# Patient Record
Sex: Male | Born: 1935 | Race: White | Hispanic: No | Smoking: Former smoker
Health system: Southern US, Community
[De-identification: ages and names within clinical notes are randomized; demographics above are authoritative.]

## PROBLEM LIST (undated history)

## (undated) DIAGNOSIS — I2510 Atherosclerotic heart disease of native coronary artery without angina pectoris: Secondary | ICD-10-CM

## (undated) DIAGNOSIS — I10 Essential (primary) hypertension: Secondary | ICD-10-CM

## (undated) DIAGNOSIS — F0390 Unspecified dementia without behavioral disturbance: Secondary | ICD-10-CM

## (undated) DIAGNOSIS — I429 Cardiomyopathy, unspecified: Secondary | ICD-10-CM

## (undated) DIAGNOSIS — E7800 Pure hypercholesterolemia, unspecified: Secondary | ICD-10-CM

## (undated) DIAGNOSIS — I5022 Chronic systolic (congestive) heart failure: Secondary | ICD-10-CM

## (undated) DIAGNOSIS — E119 Type 2 diabetes mellitus without complications: Secondary | ICD-10-CM

## (undated) HISTORY — DX: Cardiomyopathy, unspecified: I42.9

## (undated) HISTORY — DX: Chronic systolic (congestive) heart failure: I50.22

## (undated) HISTORY — PX: CATARACT EXTRACTION: SUR2

## (undated) HISTORY — PX: CARDIAC SURGERY: SHX584

---

## 2013-07-11 ENCOUNTER — Other Ambulatory Visit: Payer: Self-pay

## 2013-07-11 ENCOUNTER — Emergency Department (HOSPITAL_COMMUNITY): Payer: Medicare Other

## 2013-07-11 ENCOUNTER — Inpatient Hospital Stay (HOSPITAL_COMMUNITY)
Admission: EM | Admit: 2013-07-11 | Discharge: 2013-07-12 | DRG: 311 | Disposition: A | Discharge status: Home / Self Care | Payer: Medicare Other | Attending: Internal Medicine | Admitting: Internal Medicine

## 2013-07-11 ENCOUNTER — Encounter (HOSPITAL_COMMUNITY): Payer: Self-pay | Admitting: Emergency Medicine

## 2013-07-11 DIAGNOSIS — I5022 Chronic systolic (congestive) heart failure: Secondary | ICD-10-CM | POA: Diagnosis present

## 2013-07-11 DIAGNOSIS — I472 Ventricular tachycardia, unspecified: Secondary | ICD-10-CM | POA: Diagnosis present

## 2013-07-11 DIAGNOSIS — Z79899 Other long term (current) drug therapy: Secondary | ICD-10-CM

## 2013-07-11 DIAGNOSIS — I509 Heart failure, unspecified: Secondary | ICD-10-CM | POA: Diagnosis present

## 2013-07-11 DIAGNOSIS — I4729 Other ventricular tachycardia: Secondary | ICD-10-CM | POA: Diagnosis present

## 2013-07-11 DIAGNOSIS — I739 Peripheral vascular disease, unspecified: Secondary | ICD-10-CM

## 2013-07-11 DIAGNOSIS — H9190 Unspecified hearing loss, unspecified ear: Secondary | ICD-10-CM | POA: Diagnosis present

## 2013-07-11 DIAGNOSIS — E119 Type 2 diabetes mellitus without complications: Secondary | ICD-10-CM

## 2013-07-11 DIAGNOSIS — F0390 Unspecified dementia without behavioral disturbance: Secondary | ICD-10-CM | POA: Diagnosis present

## 2013-07-11 DIAGNOSIS — E785 Hyperlipidemia, unspecified: Secondary | ICD-10-CM | POA: Diagnosis present

## 2013-07-11 DIAGNOSIS — IMO0002 Reserved for concepts with insufficient information to code with codable children: Secondary | ICD-10-CM

## 2013-07-11 DIAGNOSIS — I209 Angina pectoris, unspecified: Principal | ICD-10-CM | POA: Diagnosis present

## 2013-07-11 DIAGNOSIS — R079 Chest pain, unspecified: Secondary | ICD-10-CM

## 2013-07-11 DIAGNOSIS — I447 Left bundle-branch block, unspecified: Secondary | ICD-10-CM | POA: Diagnosis present

## 2013-07-11 DIAGNOSIS — Z9861 Coronary angioplasty status: Secondary | ICD-10-CM

## 2013-07-11 DIAGNOSIS — IMO0001 Reserved for inherently not codable concepts without codable children: Secondary | ICD-10-CM | POA: Diagnosis present

## 2013-07-11 DIAGNOSIS — I4891 Unspecified atrial fibrillation: Secondary | ICD-10-CM

## 2013-07-11 DIAGNOSIS — I498 Other specified cardiac arrhythmias: Secondary | ICD-10-CM

## 2013-07-11 DIAGNOSIS — E876 Hypokalemia: Secondary | ICD-10-CM

## 2013-07-11 DIAGNOSIS — M069 Rheumatoid arthritis, unspecified: Secondary | ICD-10-CM | POA: Diagnosis present

## 2013-07-11 DIAGNOSIS — I2510 Atherosclerotic heart disease of native coronary artery without angina pectoris: Secondary | ICD-10-CM

## 2013-07-11 DIAGNOSIS — Z87891 Personal history of nicotine dependence: Secondary | ICD-10-CM

## 2013-07-11 DIAGNOSIS — I2589 Other forms of chronic ischemic heart disease: Secondary | ICD-10-CM | POA: Diagnosis present

## 2013-07-11 DIAGNOSIS — Z7902 Long term (current) use of antithrombotics/antiplatelets: Secondary | ICD-10-CM

## 2013-07-11 DIAGNOSIS — E1165 Type 2 diabetes mellitus with hyperglycemia: Secondary | ICD-10-CM

## 2013-07-11 DIAGNOSIS — I10 Essential (primary) hypertension: Secondary | ICD-10-CM | POA: Diagnosis present

## 2013-07-11 HISTORY — DX: Atherosclerotic heart disease of native coronary artery without angina pectoris: I25.10

## 2013-07-11 HISTORY — DX: Type 2 diabetes mellitus without complications: E11.9

## 2013-07-11 HISTORY — DX: Unspecified dementia, unspecified severity, without behavioral disturbance, psychotic disturbance, mood disturbance, and anxiety: F03.90

## 2013-07-11 HISTORY — DX: Essential (primary) hypertension: I10

## 2013-07-11 HISTORY — DX: Pure hypercholesterolemia, unspecified: E78.00

## 2013-07-11 LAB — TROPONIN I: Troponin I: <0.3 ng/mL (ref <0.30)

## 2013-07-11 LAB — CBC WITH DIFFERENTIAL/PLATELET
Basophils Absolute: 0 10*3/uL (ref 0.0–0.1)
Basophils Relative: 0 % (ref 0–1)
Eosinophils Relative: 4 % (ref 0–5)
Lymphocytes Relative: 14 % (ref 12–46)
Lymphs Abs: 1.2 10*3/uL (ref 0.7–4.0)
MCV: 85.7 fL (ref 78.0–100.0)
Neutrophils Relative %: 70 % (ref 43–77)
Platelets: 192 10*3/uL (ref 150–400)
RBC: 4.47 MIL/uL (ref 4.22–5.81)
RDW: 12.9 % (ref 11.5–15.5)
WBC: 8.9 10*3/uL (ref 4.0–10.5)

## 2013-07-11 LAB — COMPREHENSIVE METABOLIC PANEL
ALT: 13 U/L (ref 0–53)
AST: 16 U/L (ref 0–37)
Alkaline Phosphatase: 99 U/L (ref 39–117)
CO2: 28 mEq/L (ref 19–32)
Calcium: 8.9 mg/dL (ref 8.4–10.5)
Creatinine, Ser: 0.95 mg/dL (ref 0.50–1.35)
GFR calc non Af Amer: 79 mL/min — ABNORMAL LOW (ref >90)
Potassium: 3.4 mEq/L — ABNORMAL LOW (ref 3.5–5.1)
Sodium: 138 mEq/L (ref 135–145)
Total Protein: 6.5 g/dL (ref 6.0–8.3)

## 2013-07-11 LAB — GLUCOSE, CAPILLARY
Glucose-Capillary: 190 mg/dL — ABNORMAL HIGH (ref 70–99)
Glucose-Capillary: 184 mg/dL — ABNORMAL HIGH (ref 70–99)
Glucose-Capillary: 158 mg/dL — ABNORMAL HIGH (ref 70–99)

## 2013-07-11 LAB — PROTIME-INR
INR: 1.12 (ref 0.00–1.49)
Prothrombin Time: 14.2 seconds (ref 11.6–15.2)

## 2013-07-11 LAB — PRO B NATRIURETIC PEPTIDE: Pro B Natriuretic peptide (BNP): 3306 pg/mL — ABNORMAL HIGH (ref 0–450)

## 2013-07-11 MED ORDER — ATORVASTATIN CALCIUM 40 MG PO TABS
40.0000 mg | ORAL_TABLET | Freq: Every day | ORAL | Status: DC
  Administered 2013-07-12: 40 mg via ORAL
  Filled 2013-07-11: qty 1

## 2013-07-11 MED ORDER — FUROSEMIDE 40 MG PO TABS
40.0000 mg | ORAL_TABLET | Freq: Two times a day (BID) | ORAL | Status: DC
  Administered 2013-07-12 (×2): 40 mg via ORAL
  Filled 2013-07-11 (×4): qty 1

## 2013-07-11 MED ORDER — TRAZODONE HCL 50 MG PO TABS
50.0000 mg | ORAL_TABLET | Freq: Every day | ORAL | Status: DC
  Filled 2013-07-11 (×2): qty 1

## 2013-07-11 MED ORDER — ENOXAPARIN SODIUM 40 MG/0.4ML ~~LOC~~ SOLN
40.0000 mg | SUBCUTANEOUS | Status: DC
  Administered 2013-07-11 – 2013-07-12 (×2): 40 mg via SUBCUTANEOUS
  Filled 2013-07-11 (×2): qty 0.4

## 2013-07-11 MED ORDER — NITROGLYCERIN 2 % TD OINT
0.5000 [in_us] | TOPICAL_OINTMENT | TRANSDERMAL | Status: AC
  Administered 2013-07-11: 0.5 [in_us] via TOPICAL
  Filled 2013-07-11: qty 1

## 2013-07-11 MED ORDER — TRAMADOL HCL 50 MG PO TABS
50.0000 mg | ORAL_TABLET | Freq: Four times a day (QID) | ORAL | Status: DC | PRN
  Administered 2013-07-11 – 2013-07-12 (×2): 50 mg via ORAL
  Filled 2013-07-11 (×2): qty 1

## 2013-07-11 MED ORDER — POTASSIUM CHLORIDE CRYS ER 20 MEQ PO TBCR
20.0000 meq | EXTENDED_RELEASE_TABLET | Freq: Once | ORAL | Status: AC
  Administered 2013-07-11: 20 meq via ORAL
  Filled 2013-07-11: qty 1

## 2013-07-11 MED ORDER — PREDNISONE 5 MG PO TABS
5.0000 mg | ORAL_TABLET | Freq: Every day | ORAL | Status: DC
  Administered 2013-07-12: 07:00:00 5 mg via ORAL
  Filled 2013-07-11 (×2): qty 1

## 2013-07-11 MED ORDER — CLOPIDOGREL BISULFATE 75 MG PO TABS
75.0000 mg | ORAL_TABLET | Freq: Every day | ORAL | Status: DC
  Administered 2013-07-12: 11:00:00 75 mg via ORAL
  Filled 2013-07-11 (×2): qty 1

## 2013-07-11 MED ORDER — SODIUM CHLORIDE 0.9 % IJ SOLN
3.0000 mL | Freq: Two times a day (BID) | INTRAMUSCULAR | Status: DC
  Administered 2013-07-11 – 2013-07-12 (×2): 3 mL via INTRAVENOUS

## 2013-07-11 MED ORDER — ASPIRIN 81 MG PO CHEW
81.0000 mg | CHEWABLE_TABLET | Freq: Every day | ORAL | Status: DC
  Administered 2013-07-11 – 2013-07-12 (×2): 81 mg via ORAL
  Filled 2013-07-11 (×2): qty 1

## 2013-07-11 MED ORDER — METOPROLOL TARTRATE 12.5 MG HALF TABLET
12.5000 mg | ORAL_TABLET | Freq: Two times a day (BID) | ORAL | Status: DC
  Administered 2013-07-11 (×2): 12.5 mg via ORAL
  Filled 2013-07-11 (×4): qty 1

## 2013-07-11 NOTE — H&P (Signed)
Date: 07/11/2013               Patient Name:  Oscar Calderon MRN: [id number]  DOB: Apr 09, 1935 Age / Sex: 78 y.o., male   PCP: No Pcp Per Patient         Medical Service: Internal Medicine Teaching Service         Attending Physician: Dr. Farley Ly, MD    First Contact: Dr. Boykin Peek, MD Pager: [phone]  Second Contact: Dr. Dow Adolph, MD Pager: [phone]       After Hours (After 5p/  First Contact Pager: [phone]  weekends / holidays): Second Contact Pager: [phone]   Chief Complaint: Chest Pain  History of Present Illness: Pt is a 78 yo male from [place], [place] with a PMH significant for HTN, DM2, dyslipidemia, RA, CAD s/p stents, Atrial Fibrillation.  Patient c/o chest pain. Onset was last evening at 1:00am while he was lying in the bed.  He took 2 NTG tabs with no relief.  He states the pain is sharp and located at his left shoulder and radiates across his chest to his right upper abdomen.  He says the CP was continuous and then began to radiate into his left neck.  He states it "felt like gas."  He denies any N/V, cough, diaphoresis, dizziness, or presyncope.  He states he has SOB but this is at his baseline. He is without orthopnea or PND.  He also states the CP feels like his normal angina but is slightly worse.  He states that NTG usually makes his CP go away.  Currently he denies any CP after receiving NTG paste in the ED.  He denies any GERD symptoms.  He does report having moved a queen mattress and box springs on Friday.  He denies current tobacco use, but used to smoke for 35 years ago.  He reports using CPAP at night.  He denies any other recreational drug use.   Pt has bilateral hearing aids and lives alone and has a neighbor who frequently checks on him.  He apparently was out of his medication for a time.   According to ED notes, he received 3 NTG tablets and ASA by EMS and his CP decreased to 3/10. His EKG in route was suspicious for a STEMI which was called by  EMS. Cardiology was present upon arrival and activated code STEMI.  Pt appears to have a LBBB but we have no previous EKG for comparison. There were no concerning features of the LBBB.  He was afebrile and VS were wnl except for mild bradycardia (46-79).  He received NTG paste and called IMTS to admit.  Additionally, he has a h/o atrial fibrillation but refuses coumadin.   Meds:  Current Facility-Administered Medications  Medication Dose Route Frequency Provider Last Rate Last Dose  . aspirin chewable tablet 81 mg  81 mg Oral Daily Jodelle Gross, NP   81 mg at 07/11/13 1347  . [START ON 07/12/2013] clopidogrel (PLAVIX) tablet 75 mg  75 mg Oral Q breakfast Jodelle Gross, NP      . furosemide (LASIX) tablet 40 mg  40 mg Oral BID Jodelle Gross, NP      . metoprolol tartrate (LOPRESSOR) tablet 12.5 mg  12.5 mg Oral BID Jodelle Gross, NP   12.5 mg at 07/11/13 1348    Prescriptions prior to admission  Medication Sig Dispense Refill  . atorvastatin (LIPITOR) 40 MG tablet Take 40 mg by mouth daily.      Marland Kitchen  clopidogrel (PLAVIX) 75 MG tablet Take 75 mg by mouth daily with breakfast.      . furosemide (LASIX) 40 MG tablet Take 40 mg by mouth 2 (two) times daily.      . hydrocortisone cream 1 % Apply 1 application topically as needed for itching.      Marland Kitchen MENTHOL-METHYL SALICYLATE EX Apply 1 application topically 3 (three) times daily as needed (muscle pain).      . metoprolol succinate (TOPROL-XL) 25 MG 24 hr tablet Take 25 mg by mouth daily.      . predniSONE (DELTASONE) 5 MG tablet Take 5 mg by mouth daily with breakfast.      . traMADol (ULTRAM) 50 MG tablet Take 50 mg by mouth every 6 (six) hours as needed for moderate pain.      . traZODone (DESYREL) 50 MG tablet Take 50 mg by mouth at bedtime.        Allergies:  Allergies as of 07/11/2013 - Review Complete 07/11/2013  Allergen Reaction Noted  . Codeine  07/11/2013  . Other  07/11/2013   Past Medical History  Diagnosis  Date  . Hypertension   . Diabetes   . Hypercholesterolemia   . Dementia   . CAD (coronary artery disease)     Stent in 2013 at [facility], with 3 previous stents to unknown arteries at unknown date.   Past Surgical History  Procedure Laterality Date  . Cardiac surgery    . Cataract extraction     No family history on file. History   Social History  . Marital Status: Widowed    Spouse Name: N/A    Number of Children: N/A  . Years of Education: N/A   Occupational History  . Not on file.   Social History Main Topics  . Smoking status: Former Games developer  . Smokeless tobacco: Not on file  . Alcohol Use: No  . Drug Use: No  . Sexual Activity: Not on file   Other Topics Concern  . Not on file   Social History Narrative  . No narrative on file    Review of Systems: Pertinent items are noted in HPI.  Physical Exam: Physical Exam  Vitals reviewed. Constitutional: He is oriented to person, place, and time and well-developed, well-nourished, and in no distress. No distress.  HENT:  Head: Normocephalic.  Right Ear: No decreased hearing is noted.  Left Ear: No decreased hearing is noted.  Mouth/Throat: Oropharynx is clear and moist.  b/l hearing aids    Eyes: Conjunctivae and EOM are normal. Pupils are equal, round, and reactive to light.  Neck: Neck supple. No JVD present.  Cardiovascular: Normal rate and intact distal pulses.  An irregularly irregular rhythm present. Exam reveals no gallop and no friction rub.   No murmur heard. Pulmonary/Chest: Effort normal. No accessory muscle usage. Not tachypneic. No respiratory distress. He has decreased breath sounds. He has no wheezes. He has no rales. He exhibits no tenderness.  Abdominal: Soft. Bowel sounds are normal. He exhibits no distension. There is no tenderness.  Neurological: He is alert and oriented to person, place, and time.  Skin: Skin is warm and dry. He is not diaphoretic.    Lab results:   Basic Metabolic  Panel:  Recent Labs  07/11/13 0945  NA 138  K 3.4*  CL 100  CO2 28  GLUCOSE 188*  BUN 17  CREATININE 0.95  CALCIUM 8.9   Liver Function Tests:  Recent Labs  07/11/13 0945  AST 16  ALT 13  ALKPHOS 99  BILITOT 0.9  PROT 6.5  ALBUMIN 3.7   CBC:  Recent Labs  07/11/13 0945  WBC 8.9  NEUTROABS 6.2  HGB 13.5  HCT 38.3*  MCV 85.7  PLT 192   Cardiac Enzymes:  Recent Labs  07/11/13 0945 07/11/13 1420  TROPONINI <0.30 <0.30   BNP:  Recent Labs  07/11/13 0945  PROBNP 3306.0*   CBG:  Recent Labs  07/11/13 0933  GLUCAP 190*   Coagulation:  Recent Labs  07/11/13 0945  LABPROT 14.2  INR 1.12    Imaging results:  Dg Chest Port 1 View  07/11/2013   CLINICAL DATA:  Chest pain  EXAM: PORTABLE CHEST - 1 VIEW  COMPARISON:  None.  FINDINGS: The cardiac shadow is mildly enlarged. Mild vascular congestion is noted. No focal infiltrate is seen. No acute bony abnormality is noted.  IMPRESSION: Mild congestive failure.   Electronically Signed   By: Alcide Clever M.D.   On: 07/11/2013 10:10    Other results: Orders placed during the hospital encounter of 07/11/13  . EKG 12-LEAD  . EKG 12-LEAD     Assessment & Plan by Problem: Principal Problem:   Cardiac angina Active Problems:   Peripheral vascular disease   Diabetes   Coronary artery disease   Acute angina  #Atypical Chest Pain- Pt has risk factors for ACS including age, DM2, HTN, dyslipidemia, CAD s/p 4 stents.  TIMI score: 4, which is a 20% risk at 14 days of: all-cause mortality, new or recurrent MI, or severe recurrent ischemia requiring urgent revascularization.  ACS seems unlikely given CP is continuous, description of sharp quality, non-exertional, "feels like gas".  However, ACS must be r/o given risk factors.  Other possible etiologies include:  musculoskeletal (pt reports moving a mattress/box springs 2 days ago), pneumothorax (no CXR findings), aortic dissection (pain not described as  tearing/ripping, no widened mediastinum on CXR), PE (no pleuritic CP, no tachycardia, Wells Score: 0), GERD (denies symptoms), anxiety (pt does not appear anxious/no previous h/o), pneumonia (afebrile, not pleuritic CP, no leukocytosis).  Troponin X 3 negative.  EKG does show some T wave inversions in V5, V6-no previous EKG for comparison.  He did have a 2-3 s run of non-sustained V-tach.  Cardiology consulted and recommend obtaining records from [facility] and will get TTE tomorrow.  -appreciate cardiology recs -telemetry -TTE -ASA  -NTG  -continue home meds -O2 -lipid panel -HA1c -CMP  #Atrial Fibrillation-Pt has atrial fibrillation on EKG with slow ventricular response.  Pt has refused to take coumadin now and in the past.  CHADS2 Score: 3. High risk of thromboembolic event. 5.9% risk of event per year if no coumadin.  The adjusted stroke rate was the expected stroke rate per 100 person-years derived from the multivariable model assuming that aspirin was not taken.  INR is sub-therapeutic, however, pt is not on coumadin.   Lab Results  Component Value Date   INR 1.12 07/11/2013   -ASA 81mg  daily -Plavix daily -pt refuses coumadin   #DM2- Pt was on SSI at home; his PCP changed his dose 2 days ago.    -HA1c -CBG  -SSI?  #HTN- Stable.   BP Readings from Last 3 Encounters:  07/11/13 132/82   -continue home meds -TTE tomorrow  #Dyslipidemia-   -continue home meds -lipid panel   #h/o RA- Pt reports a h/o RA for which he takes prednisone 5mg  daily.  -continue home meds   Dispo: Disposition  is deferred at this time, awaiting improvement of current medical problems. Anticipated discharge in approximately 1-2 day(s).   The patient does have a current PCP and does need an [facility] hospital follow-up appointment after discharge.   Signed: Boykin Peek, MD 07/11/2013, 4:12 PM

## 2013-07-11 NOTE — ED Notes (Signed)
Internal medicine at bedside
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
## 2013-07-11 NOTE — ED Notes (Signed)
PT STATES DOES NOT READ/WRITE.

## 2013-07-11 NOTE — Significant Event (Signed)
Patient hr dropped to 86 Dr Anne Fu noticed up to see patient

## 2013-07-11 NOTE — ED Notes (Signed)
PT reports CP started at 0300 center chest radiating to bilateral jaw. Pt has a Hx of total 5 stents.EMS gave a total of 3 nitro SL.

## 2013-07-11 NOTE — Significant Event (Signed)
Dr Anne Fu called notified  that patient came up from ED with pacer pads on that were not reported by ED RN to 4E RN, and without MD order . Dr Anne Fu confirmed may D/C pacer pads.

## 2013-07-11 NOTE — ED Notes (Signed)
CBG 190

## 2013-07-11 NOTE — ED Notes (Signed)
Cardiology MD at bedside.

## 2013-07-11 NOTE — Consult Note (Signed)
Patient was examined and history, data, reviewed after being obtained by Joni Reining, NP. I agree with above. Atypical chest discomfort, mostly right-sided. Multiple social issues including mild dementia.  Prior to Admission medications   Medication  Sig  Start Date  End Date  Taking?  Authorizing Provider   atorvastatin (LIPITOR) 40 MG tablet  Take 40 mg by mouth daily.    Yes  Historical Provider, MD   clopidogrel (PLAVIX) 75 MG tablet  Take 75 mg by mouth daily with breakfast.    Yes  Historical Provider, MD   furosemide (LASIX) 40 MG tablet  Take 40 mg by mouth 2 (two) times daily.    Yes  Historical Provider, MD   hydrocortisone cream 1 %  Apply 1 application topically as needed for itching.    Yes  Historical Provider, MD   MENTHOL-METHYL SALICYLATE EX  Apply 1 application topically 3 (three) times daily as needed (muscle pain).    Yes  Historical Provider, MD   metoprolol succinate (TOPROL-XL) 25 MG 24 hr tablet  Take 25 mg by mouth daily.    Yes  Historical Provider, MD   predniSONE (DELTASONE) 5 MG tablet  Take 5 mg by mouth daily with breakfast.    Yes  Historical Provider, MD   traMADol (ULTRAM) 50 MG tablet  Take 50 mg by mouth every 6 (six) hours as needed for moderate pain.    Yes  Historical Provider, MD   traZODone (DESYREL) 50 MG tablet  Take 50 mg by mouth at bedtime.    Yes  Historical Provider, MD   1. Chest pain-he has been off of his medications he states for quite some time. First troponin is normal. EKG demonstrates intraventricular conduction delay, left bundle branch block. We will go ahead and obtain records from [facility] where most recent stent has been placed per patient. At this point, until we gather from the records, I would hold off on further cardiac testing to prevent reduplication.  2. Atrial fibrillation-noted on telemetry as well as EKGs in emergency department. He was not familiar with the term atrial fibrillation however when asked if he ever been on Coumadin  he clearly stated that he would never take that "rat poisoning ". I stated that we could discuss this further and he clearly told me that he would not take this medication. He understands increased risk for stroke. Currently on Plavix which will decrease her stroke risk slightly.  3. Peripheral vascular disease-prior iliac stents per patient. Good distal pulses. No claudication.  4. Nonsustained ventricular tachycardia-brief run of approximately 10 beats seen in emergency department. This wide-complex tachycardia could also be atrial fibrillation with aberrancy. Most likely VT. Asymptomatic. Try to use home dose beta blocker if able to from heart rate perspective.  5. Suspected cardiomyopathy-likely ischemic. Would correlate with NSVT. I suspect decreased EF as well. Obtain records. Continue home dose Lasix 40 twice a day.  His troponins remained normal and he remains comfortable, early discharge may be applicable in his recent cardiac workup at [facility].  Maryclare Labrador continue to follow. Appreciate internal medicine team.  Donato Schultz, MD  07/11/13, 1:32pm

## 2013-07-11 NOTE — ED Provider Notes (Signed)
CSN: [id number]     Arrival date & time 07/11/13  0915 History   First MD Initiated Contact with Patient 07/11/13 [id number]     Chief Complaint  Patient presents with  . Chest Pain   (Consider location/radiation/quality/duration/timing/severity/associated sxs/prior Treatment) Patient is a 78 y.o. male presenting with chest pain. The history is provided by the patient.  Chest Pain Pain location:  L chest Pain quality: sharp   Pain radiates to:  Neck and L shoulder Pain radiates to the back: no   Pain severity:  Moderate Onset quality:  Gradual Timing:  Intermittent Progression:  Worsening Chronicity:  Recurrent Context: at rest   Relieved by:  Nitroglycerin Worsened by:  Nothing tried Ineffective treatments:  None tried Associated symptoms: shortness of breath   Associated symptoms: no abdominal pain, no cough, no fever, no headache, no nausea, no numbness and not vomiting     Past Medical History  Diagnosis Date  . Heart attack     Hx of stents   Past Surgical History  Procedure Laterality Date  . Cardiac surgery     No family history on file. History  Substance Use Topics  . Smoking status: Not on file  . Smokeless tobacco: Not on file  . Alcohol Use: Not on file    Review of Systems  Constitutional: Negative for fever.  HENT: Negative for drooling and rhinorrhea.   Eyes: Negative for pain.  Respiratory: Positive for shortness of breath. Negative for cough.   Cardiovascular: Positive for chest pain. Negative for leg swelling.  Gastrointestinal: Negative for nausea, vomiting, abdominal pain and diarrhea.  Genitourinary: Negative for dysuria and hematuria.  Musculoskeletal: Negative for gait problem and neck pain.  Skin: Negative for color change.  Neurological: Negative for numbness and headaches.  Hematological: Negative for adenopathy.  Psychiatric/Behavioral: Negative for behavioral problems.  All other systems reviewed and are negative.    Allergies   Codeine and Other  Home Medications   Current Outpatient Rx  Name  Route  Sig  Dispense  Refill  . atorvastatin (LIPITOR) 40 MG tablet   Oral   Take 40 mg by mouth daily.         . clopidogrel (PLAVIX) 75 MG tablet   Oral   Take 75 mg by mouth daily with breakfast.         . furosemide (LASIX) 40 MG tablet   Oral   Take 40 mg by mouth 2 (two) times daily.         . hydrocortisone cream 1 %   Topical   Apply 1 application topically as needed for itching.         Marland Kitchen MENTHOL-METHYL SALICYLATE EX   Apply externally   Apply 1 application topically 3 (three) times daily as needed (muscle pain).         . metoprolol succinate (TOPROL-XL) 25 MG 24 hr tablet   Oral   Take 25 mg by mouth daily.         . predniSONE (DELTASONE) 5 MG tablet   Oral   Take 5 mg by mouth daily with breakfast.         . traMADol (ULTRAM) 50 MG tablet   Oral   Take 50 mg by mouth every 6 (six) hours as needed for moderate pain.         . traZODone (DESYREL) 50 MG tablet   Oral   Take 50 mg by mouth at bedtime.  Temp(Src) 98.3 F (36.8 C) (Oral) Physical Exam  Nursing note and vitals reviewed. Constitutional: He is oriented to person, place, and time. He appears well-developed and well-nourished.  HENT:  Head: Normocephalic and atraumatic.  Right Ear: External ear normal.  Left Ear: External ear normal.  Nose: Nose normal.  Mouth/Throat: Oropharynx is clear and moist. No oropharyngeal exudate.  Eyes: Conjunctivae and EOM are normal. Pupils are equal, round, and reactive to light.  Neck: Normal range of motion. Neck supple.  Cardiovascular: Normal rate, regular rhythm, normal heart sounds and intact distal pulses.  Exam reveals no gallop and no friction rub.   No murmur heard. Pulmonary/Chest: Effort normal and breath sounds normal. No respiratory distress. He has no wheezes.  Abdominal: Soft. Bowel sounds are normal. He exhibits no distension. There is no  tenderness. There is no rebound and no guarding.  Musculoskeletal: Normal range of motion. He exhibits no edema and no tenderness.  Neurological: He is alert and oriented to person, place, and time.  Skin: Skin is warm and dry.  Psychiatric: He has a normal mood and affect. His behavior is normal.    ED Course  Procedures (including critical care time) Labs Review Labs Reviewed  CBC WITH DIFFERENTIAL - Abnormal; Notable for the following:    HCT 38.3 (*)    Monocytes Relative 13 (*)    Monocytes Absolute 1.1 (*)    All other components within normal limits  COMPREHENSIVE METABOLIC PANEL - Abnormal; Notable for the following:    Potassium 3.4 (*)    Glucose, Bld 188 (*)    GFR calc non Af Amer 79 (*)    All other components within normal limits  PRO B NATRIURETIC PEPTIDE - Abnormal; Notable for the following:    Pro B Natriuretic peptide (BNP) 3306.0 (*)    All other components within normal limits  GLUCOSE, CAPILLARY - Abnormal; Notable for the following:    Glucose-Capillary 190 (*)    All other components within normal limits  GLUCOSE, CAPILLARY - Abnormal; Notable for the following:    Glucose-Capillary 184 (*)    All other components within normal limits  COMPREHENSIVE METABOLIC PANEL - Abnormal; Notable for the following:    Potassium 3.4 (*)    Glucose, Bld 168 (*)    Albumin 3.3 (*)    GFR calc non Af Amer 71 (*)    GFR calc Af Amer 83 (*)    All other components within normal limits  LIPID PANEL - Abnormal; Notable for the following:    HDL 37 (*)    All other components within normal limits  GLUCOSE, CAPILLARY - Abnormal; Notable for the following:    Glucose-Capillary 158 (*)    All other components within normal limits  GLUCOSE, CAPILLARY - Abnormal; Notable for the following:    Glucose-Capillary 146 (*)    All other components within normal limits  TROPONIN I  PROTIME-INR  TROPONIN I  TROPONIN I  TROPONIN I  HEMOGLOBIN A1C  TSH   Imaging Review Dg  Chest Port 1 View  07/11/2013   CLINICAL DATA:  Chest pain  EXAM: PORTABLE CHEST - 1 VIEW  COMPARISON:  None.  FINDINGS: The cardiac shadow is mildly enlarged. Mild vascular congestion is noted. No focal infiltrate is seen. No acute bony abnormality is noted.  IMPRESSION: Mild congestive failure.   Electronically Signed   By: Alcide Clever M.D.   On: 07/11/2013 10:10     Date: 07/11/2013  Rate: 69  Rhythm: atrial fibrillation  QRS Axis: left  Intervals: PR indeterminate, QTc wnl  ST/T Wave abnormalities: normal  Conduction Disutrbances:left bundle branch block  Narrative Interpretation: LBBB, no concordant ST elev, no concordant ST dep in V1-V3, no excessive discordant ST elev noted  Old EKG Reviewed: none available   MDM   1. Chest pain   2. Atrial fibrillation   3. Coronary artery disease   4. Diabetes   5. Peripheral vascular disease   6. Cardiac angina    9:38 AM 78 y.o. male who presents with chest pain. He states that the pain began sometime last night and was intermittent throughout the night. He describes the pain as sharp, left-sided, and radiating to his neck and left shoulder. He took 2 nitroglycerin tablets at approximately 6 AM with mild relief and called EMS. He got 3 nitroglycerin tablets and an aspirin by EMS and his chest pain decreased to a 3/10. His EKG in route was suspicious for a STEMI which was called by EMS. Cardiology was present upon arrival and de-activated the code STEMI. The patient appears to have a left bundle branch block, we have no previous EKG for comparison. There are no concerning features of the left bundle branch block. He is afebrile and vital signs are unremarkable on exam. Will get screening labwork, place nitro paste, and workup for chest pain.  Pt continues to appear well. He states that he has been admitted to several other hospitals including [address], [address], [address] I am unable to get these records through epic, so I have no other medical  records to review. Discussed case w/ Cards who will consult. Will admit to internal medicine teaching service as pt follows w/ a pcp in [place] ([facility]     Junius Argyle, MD 07/12/13 1059

## 2013-07-12 DIAGNOSIS — R0789 Other chest pain: Secondary | ICD-10-CM

## 2013-07-12 DIAGNOSIS — E878 Other disorders of electrolyte and fluid balance, not elsewhere classified: Secondary | ICD-10-CM

## 2013-07-12 DIAGNOSIS — E785 Hyperlipidemia, unspecified: Secondary | ICD-10-CM

## 2013-07-12 DIAGNOSIS — I059 Rheumatic mitral valve disease, unspecified: Secondary | ICD-10-CM

## 2013-07-12 LAB — COMPREHENSIVE METABOLIC PANEL
AST: 15 U/L (ref 0–37)
Alkaline Phosphatase: 99 U/L (ref 39–117)
BUN: 15 mg/dL (ref 6–23)
CO2: 28 mEq/L (ref 19–32)
Calcium: 9 mg/dL (ref 8.4–10.5)
Chloride: 102 mEq/L (ref 96–112)
GFR calc Af Amer: 83 mL/min — ABNORMAL LOW (ref >90)
GFR calc non Af Amer: 71 mL/min — ABNORMAL LOW (ref >90)
Glucose, Bld: 168 mg/dL — ABNORMAL HIGH (ref 70–99)
Potassium: 3.4 mEq/L — ABNORMAL LOW (ref 3.5–5.1)
Total Bilirubin: 1 mg/dL (ref 0.3–1.2)
Total Protein: 6.2 g/dL (ref 6.0–8.3)

## 2013-07-12 LAB — LIPID PANEL
Cholesterol: 135 mg/dL (ref 0–200)
HDL: 37 mg/dL — ABNORMAL LOW (ref >39)
LDL Cholesterol: 75 mg/dL (ref 0–99)
VLDL: 23 mg/dL (ref 0–40)

## 2013-07-12 LAB — HEMOGLOBIN A1C: Hgb A1c MFr Bld: 11.2 % — ABNORMAL HIGH (ref <5.7)

## 2013-07-12 LAB — GLUCOSE, CAPILLARY
Glucose-Capillary: 258 mg/dL — ABNORMAL HIGH (ref 70–99)
Glucose-Capillary: 247 mg/dL — ABNORMAL HIGH (ref 70–99)

## 2013-07-12 LAB — TROPONIN I: Troponin I: <0.3 ng/mL (ref <0.30)

## 2013-07-12 MED ORDER — POTASSIUM CHLORIDE CRYS ER 20 MEQ PO TBCR
20.0000 meq | EXTENDED_RELEASE_TABLET | Freq: Every day | ORAL | Status: DC
  Administered 2013-07-12: 11:00:00 20 meq via ORAL
  Filled 2013-07-12: qty 1

## 2013-07-12 NOTE — Progress Notes (Signed)
Subjective:  No more pain. Pain medication helped his right shoulder pain. No SOB.   Objective:  Vital Signs in the last 24 hours: Temp:  [97.3 F (36.3 C)-98.3 F (36.8 C)] 97.9 F (36.6 C) (12/15 0611) Pulse Rate:  [46-94] 94 (12/15 0611) Resp:  [12-25] 18 (12/15 0611) BP: (108-145)/(49-99) 130/68 mmHg (12/15 0611) SpO2:  [93 %-100 %] 98 % (12/15 0611) Weight:  [196 lb 6.9 oz (89.1 kg)-198 lb 11.2 oz (90.13 kg)] 196 lb 6.9 oz (89.1 kg) (12/15 1610)  Intake/Output from previous day: 12/14 0701 - 12/15 0700 In: 540 [P.O.:540] Out: 1300 [Urine:1300]   Physical Exam: General: Well developed, well nourished, in no acute distress. Head:  Normocephalic and atraumatic. Lungs: Clear to auscultation and percussion. Heart: Irreg irreg.   No murmur, rubs or gallops.  Abdomen: soft, non-tender, positive bowel sounds. Extremities: No clubbing or cyanosis. No edema. Neurologic: Alert and oriented, confused at times (Dementia)    Lab Results:  Recent Labs  07/11/13 0945  WBC 8.9  HGB 13.5  PLT 192    Recent Labs  07/11/13 0945 07/12/13 0211  NA 138 139  K 3.4* 3.4*  CL 100 102  CO2 28 28  GLUCOSE 188* 168*  BUN 17 15  CREATININE 0.95 1.00    Recent Labs  07/11/13 2030 07/12/13 0211  TROPONINI <0.30 <0.30   Hepatic Function Panel  Recent Labs  07/12/13 0211  PROT 6.2  ALBUMIN 3.3*  AST 15  ALT 12  ALKPHOS 99  BILITOT 1.0    Recent Labs  07/12/13 0211  CHOL 135   No results found for this basename: PROTIME,  in the last 72 hours  Imaging: Dg Chest Port 1 View  07/11/2013   CLINICAL DATA:  Chest pain  EXAM: PORTABLE CHEST - 1 VIEW  COMPARISON:  None.  FINDINGS: The cardiac shadow is mildly enlarged. Mild vascular congestion is noted. No focal infiltrate is seen. No acute bony abnormality is noted.  IMPRESSION: Mild congestive failure.   Electronically Signed   By: Alcide Clever M.D.   On: 07/11/2013 10:10   Personally viewed.   Telemetry:  AFIB occasional HR in the upper 30's asymptomatic. Personally viewed.  Cardiac Studies:  Awaiting records. [facility]. Reasonable to obtain ECHO here.   Assessment/Plan:  Principal Problem:   Cardiac angina Active Problems:   Peripheral vascular disease   Diabetes   Coronary artery disease   Acute angina  1) Atypical CP - right shoulder, likely MSK. Troponins are normal. No signs of ischemia on ECG (no T wave changes). Had recent stent placement and possible stress test. I do not feel strongly that another stress test needs to take place because of atypical nature of pain.   2) AFIB - not on anticoagulation. Discussed coumadin. He said he would never take this medication. Increased stroke risk. Dementia may be an issue as well. Sees Family Doc. In [place].    3) Bradycardia - stopped his low dose metoprolol. Avoid. Asymptomatic. No indication for pacer at this time. Discussed the possibility with him. Was not too excited about this. If heart rate adequate with ambulation, ok with this.   4) Probable cardiomyopathy - on FP note from [facility], systolic HF was listed  EF likely low. Add ACE-I if able. Avoid beta blocker because of bradycardia. Does not appear congested.   5) CAD - 4 prior stents. On Plavix, continue. Await records.   Since troponin normal, atypical CP resolved, asymptomatic brady,  I would be comfortable with discharge today. Have him follow up with prior cardiologist.    Anne Fu, Pasha Gadison 07/12/2013, 8:44 AM

## 2013-07-12 NOTE — Progress Notes (Signed)
Pt waiting for his ride  And to give d/c instructions to Shanda Bumps who will be taking care of me also verified this with Jess who stated she helps him with care in am.  She stated that she is on her way to pick up pt.  Will continue to monitor.  Amanda Pea, Charity fundraiser.

## 2013-07-12 NOTE — Progress Notes (Signed)
UR completed Nicolle Heward K. Wasim Hurlbut, RN, BSN, MSHL, CCM  07/12/2013 1:46 PM

## 2013-07-12 NOTE — Progress Notes (Signed)
All d/c instructions explained as given to pt and Shanda Bumps his care taker.  Verbalized understanding.  D/c off floor via w/c to awaiting transportation.  Amanda Pea, RN

## 2013-07-12 NOTE — Progress Notes (Signed)
Echocardiogram 2D Echocardiogram has been performed.  Dorothey Baseman 07/12/2013, 10:01 AM

## 2013-07-12 NOTE — H&P (Signed)
Internal Medicine Attending Admission Note Date: 07/12/2013  Patient name: Oscar Calderon Medical record number: [id number] Date of birth: Dec 22, 1934 Age: 78 y.o. Gender: male  I saw and evaluated the patient. I reviewed the resident's note and I agree with the resident's findings and plan as documented in the resident's note, with the following additional comments.  Chief Complaint(s): Chest pain  History - key components related to admission: Patient is a 78 year old man with history of coronary artery disease status post multiple past stents, peripheral vascular disease status post bilateral stents, atrial fibrillation, diabetes mellitus, and other problems as outlined in the medical history, admitted with complaint of chest pain.  He reports that the pain began early on the morning of admission, and was located in his right upper chest radiating downward to his left lower chest.  He took sublingual nitroglycerin at home without relief, and also received nitroglycerin per EMS and in the ED.  He reports that his chest pain resolved after arrival, and has not recurred.  He reports stable chronic exertional dyspnea with mild chest discomfort; he does not commonly takes sublingual nitroglycerin, and has not taken the nitroglycerin for more than 6 weeks by his report prior to this event.    Physical Exam - key components related to admission:  Filed Vitals:   07/11/13 1650 07/11/13 2127 07/12/13 0239 07/12/13 0611  BP: 132/82 131/59 125/66 130/68  Pulse: 84 62 60 94  Temp: 97.3 F (36.3 C) 97.4 F (36.3 C) 97.5 F (36.4 C) 97.9 F (36.6 C)  TempSrc: Oral Oral Oral Oral  Resp: 20 20 19 18   Height: 6\' 1"  (1.854 m)     Weight: 198 lb 11.2 oz (90.13 kg)   196 lb 6.9 oz (89.1 kg)  SpO2: 96% 99% 99% 98%    General: Alert, no distress Lungs: Few bibasilar crackles, otherwise clear Heart: Irregular; no extra sounds or murmurs; distant heart sounds Abdomen: Bowel sounds present, soft, nontender;  no hepatosplenomegaly Extremities: No edema   Lab results:   Basic Metabolic Panel:  Recent Labs  40/98/11 0945 07/12/13 0211  NA 138 139  K 3.4* 3.4*  CL 100 102  CO2 28 28  GLUCOSE 188* 168*  BUN 17 15  CREATININE 0.95 1.00  CALCIUM 8.9 9.0    Liver Function Tests:  Recent Labs  07/11/13 0945 07/12/13 0211  AST 16 15  ALT 13 12  ALKPHOS 99 99  BILITOT 0.9 1.0  PROT 6.5 6.2  ALBUMIN 3.7 3.3*     CBC:  Recent Labs  07/11/13 0945  WBC 8.9  HGB 13.5  HCT 38.3*  MCV 85.7  PLT 192     Recent Labs  07/11/13 0945  NEUTROABS 6.2  LYMPHSABS 1.2  MONOABS 1.1*  EOSABS 0.3  BASOSABS 0.0    Cardiac Enzymes:  Recent Labs  07/11/13 1420 07/11/13 2030 07/12/13 0211  TROPONINI <0.30 <0.30 <0.30    BNP:  Recent Labs  07/11/13 0945  PROBNP 3306.0*     CBG:  Recent Labs  07/11/13 0933 07/11/13 1614 07/11/13 2045 07/12/13 0705  GLUCAP 190* 184* 158* 146*     Fasting Lipid Panel:  Recent Labs  07/12/13 0211  CHOL 135  HDL 37*  LDLCALC 75  TRIG 914  CHOLHDL 3.6    Coagulation:  Recent Labs  07/11/13 0945  INR 1.12     Imaging results:  Dg Chest Port 1 View  07/11/2013   CLINICAL DATA:  Chest pain  EXAM: PORTABLE CHEST -  1 VIEW  COMPARISON:  None.  FINDINGS: The cardiac shadow is mildly enlarged. Mild vascular congestion is noted. No focal infiltrate is seen. No acute bony abnormality is noted.  IMPRESSION: Mild congestive failure.   Electronically Signed   By: Alcide Clever M.D.   On: 07/11/2013 10:10    Other results: EKG 12/14: Atrial fibrillation with slow ventricular response with premature ventricular or aberrantly conducted complexes; left anterior hemiblock; non-specific intra-ventricular conduction block; cannot rule out Anterior infarct , age undetermined; T wave abnormality, consider lateral ischemia or digitalis effect; no old tracing to compare.    Assessment & Plan by Problem:  1.  Chest pain.  No  evidence of acute MI by cardiac enzymes or EKG.  Cardiologist feels that the chest pain is atypical, and does not currently recommend another stress test; he recommends discharge later today with outpatient cardiology followup.  A 2-D echocardiogram was done this morning, and the results are pending.  Plan is to await results of echocardiogram; mobilize out of bed with assistance on telemetry.  If patient does well, then possible discharge later today; patient reports that he has not seen a cardiologist at [facility] after his stent was placed which he says was more than a year ago; the plan is to schedule outpatient followup with our cardiology service.  2.  Chronic atrial fibrillation.  Patient refuses anticoagulation; we discussed this as well with patient and he is adamant that he does not want to be on anticoagulant.  3.  Bradycardia.  Patient's primary care physician Dr. Sol Passer in [place] had apparently started metoprolol on 07/09/2013; due to bradycardia, and on the advice of cardiology, we stopped the metoprolol.  4.  Elevated proBNP.  This is consistent with congestive heart failure, possibly ischemic.  As above, a 2-D echocardiogram is pending.  Patient does not have clinical evidence of decompensated heart failure.  5.  Coronary artery disease, status post prior stenting.  Patient reports that these were done at [facility] in [place] and at [facility] in [place].  The plan is to try to obtain outside records.  6.  Peripheral vascular disease, status post bilateral stenting per patient done in [place].  Plan is to try to obtain outside records.  Patient currently has intact bilateral distal lower extremity pulses.  7.  Mild hypokalemia.  Plan is replace potassium and follow; this also will need to be followed by his primary care physician.  8.  Uncontrolled Diabetes Mellitus.  Patient has copies of recent paperwork from his office visit 07/09/2013, which  documents a hemoglobin A1c of 11.4.  He is following his blood sugars on his current insulin regimen as per his current care physician, and understands the importance of followup for management of his diabetes.  9.  Primary care followup.  Patient is followed by Dr. Sol Passer in [place], and has an appointment there later this week.   10.  Other problems and plans as per the resident physician's note.

## 2013-07-12 NOTE — Progress Notes (Signed)
Subjective:  Pt reports feeling well this AM.  He denies any further episodes of CP or SOB.  His neighbor who helps take care of him is at the bedside.  She has records from his PCP, Dr. Sol Passer.  I have been unsuccessful in locating records at [facility] which is where he said his cardiologist was.  We have contacted Dr. Sol Passer and requested cardiology records.   Objective: Vital signs in last 24 hours: Filed Vitals:   07/11/13 1650 07/11/13 2127 07/12/13 0239 07/12/13 0611  BP: 132/82 131/59 125/66 130/68  Pulse: 84 62 60 94  Temp: 97.3 F (36.3 C) 97.4 F (36.3 C) 97.5 F (36.4 C) 97.9 F (36.6 C)  TempSrc: Oral Oral Oral Oral  Resp: 20 20 19 18   Height: 6\' 1"  (1.854 m)     Weight: 90.13 kg (198 lb 11.2 oz)   89.1 kg (196 lb 6.9 oz)  SpO2: 96% 99% 99% 98%   Weight change:   Intake/Output Summary (Last 24 hours) at 07/12/13 1134 Last data filed at 07/12/13 0802  Gross per 24 hour  Intake    780 ml  Output   1300 ml  Net   -520 ml   Constitutional: He is oriented to person, place, and time and well-developed, well-nourished, and in no distress. No distress.  HENT:  Head: Normocephalic.  Right Ear: No decreased hearing is noted.  Left Ear: No decreased hearing is noted.  Mouth/Throat: Oropharynx is clear and moist.  b/l hearing aids  Eyes: Conjunctivae and EOM are normal. Pupils are equal, round, and reactive to light.  Neck: Neck supple. No JVD present.  Cardiovascular: Normal rate and intact distal pulses. RRR present. Exam reveals no gallop and no friction rub.  No murmur heard.  Pulmonary/Chest: Effort normal. No accessory muscle usage. Not tachypneic. No respiratory distress. He has decreased breath sounds. He has no wheezes. He has no rales. He exhibits no tenderness.  Abdominal: Soft. Bowel sounds are normal. He exhibits no distension. There is no tenderness.  Neurological: He is alert and oriented to person, place, and time.  Skin: Skin is warm and dry. He is not  diaphoretic.    Lab Results: Basic Metabolic Panel:  Recent Labs Lab 07/11/13 0945 07/12/13 0211  NA 138 139  K 3.4* 3.4*  CL 100 102  CO2 28 28  GLUCOSE 188* 168*  BUN 17 15  CREATININE 0.95 1.00  CALCIUM 8.9 9.0   Liver Function Tests:  Recent Labs Lab 07/11/13 0945 07/12/13 0211  AST 16 15  ALT 13 12  ALKPHOS 99 99  BILITOT 0.9 1.0  PROT 6.5 6.2  ALBUMIN 3.7 3.3*   No results found for this basename: LIPASE, AMYLASE,  in the last 168 hours No results found for this basename: AMMONIA,  in the last 168 hours CBC:  Recent Labs Lab 07/11/13 0945  WBC 8.9  NEUTROABS 6.2  HGB 13.5  HCT 38.3*  MCV 85.7  PLT 192   Cardiac Enzymes:  Recent Labs Lab 07/11/13 1420 07/11/13 2030 07/12/13 0211  TROPONINI <0.30 <0.30 <0.30   BNP:  Recent Labs Lab 07/11/13 0945  PROBNP 3306.0*   D-Dimer: No results found for this basename: DDIMER,  in the last 168 hours CBG:  Recent Labs Lab 07/11/13 0933 07/11/13 1614 07/11/13 2045 07/12/13 0705  GLUCAP 190* 184* 158* 146*   Hemoglobin A1C: No results found for this basename: HGBA1C,  in the last 168 hours Fasting Lipid Panel:  Recent Labs  Lab 07/12/13 0211  CHOL 135  HDL 37*  LDLCALC 75  TRIG 161  CHOLHDL 3.6   Thyroid Function Tests: No results found for this basename: TSH, T4TOTAL, FREET4, T3FREE, THYROIDAB,  in the last 168 hours Coagulation:  Recent Labs Lab 07/11/13 0945  LABPROT 14.2  INR 1.12   Anemia Panel: No results found for this basename: VITAMINB12, FOLATE, FERRITIN, TIBC, IRON, RETICCTPCT,  in the last 168 hours Urine Drug Screen: Drugs of Abuse  No results found for this basename: labopia, cocainscrnur, labbenz, amphetmu, thcu, labbarb    Studies/Results: Dg Chest Port 1 View  07/11/2013   CLINICAL DATA:  Chest pain  EXAM: PORTABLE CHEST - 1 VIEW  COMPARISON:  None.  FINDINGS: The cardiac shadow is mildly enlarged. Mild vascular congestion is noted. No focal infiltrate is  seen. No acute bony abnormality is noted.  IMPRESSION: Mild congestive failure.   Electronically Signed   By: Alcide Clever M.D.   On: 07/11/2013 10:10   Medications: I have reviewed the patient's current medications. Scheduled Meds: . aspirin  81 mg Oral Daily  . atorvastatin  40 mg Oral Daily  . clopidogrel  75 mg Oral Q breakfast  . enoxaparin (LOVENOX) injection  40 mg Subcutaneous Q24H  . furosemide  40 mg Oral BID  . potassium chloride  20 mEq Oral Daily  . predniSONE  5 mg Oral Q breakfast  . sodium chloride  3 mL Intravenous Q12H  . traZODone  50 mg Oral QHS   Continuous Infusions:  PRN Meds:.traMADol Assessment/Plan: Principal Problem:   Chest pain Active Problems:   Peripheral vascular disease   Diabetes   Coronary artery disease   Acute angina  #Atypical Chest Pain- Pt has risk factors for ACS including age, DM2, HTN, dyslipidemia, CAD s/p 4 stents. TIMI score: 4, which is a 20% risk at 14 days of: all-cause mortality, new or recurrent MI, or severe recurrent ischemia requiring urgent revascularization. ACS seems unlikely given CP is continuous, description of sharp quality, non-exertional, "feels like gas". However, ACS must be r/o given risk factors. Other possible etiologies include: musculoskeletal (pt reports moving a mattress/box springs 2 days ago), pneumothorax (no CXR findings), aortic dissection (pain not described as tearing/ripping, no widened mediastinum on CXR), PE (no pleuritic CP, no tachycardia, Wells Score: 0), GERD (denies symptoms), anxiety (pt does not appear anxious/no previous h/o), pneumonia (afebrile, not pleuritic CP, no leukocytosis). Troponin X 3 negative. EKG does show some T wave inversions in V5, V6-no previous EKG for comparison. He did have a 2-3 s run of non-sustained V-tach. Cardiology consulted and recommend obtaining records from [facility] and will get TTE tomorrow.  Today, pt will get TTE and should be read later today.  Cardiology is comfortable  with d/c today and recommend close cardiology f/u.   -await records from primary -TTE pending results -HA1c pending -TSH pending  Lab Results  Component Value Date   CHOL 135 07/12/2013   HDL 37* 07/12/2013   LDLCALC 75 07/12/2013   TRIG 113 07/12/2013   CHOLHDL 3.6 07/12/2013   #Atrial Fibrillation-Pt has atrial fibrillation on EKG with slow ventricular response. Pt has refused to take coumadin now and in the past. CHADS2 Score: 3. High risk of thromboembolic event. 5.9% risk of event per year if no coumadin. The adjusted stroke rate was the expected stroke rate per 100 person-years derived from the multivariable model assuming that aspirin was not taken. INR is sub-therapeutic, however, pt is not on coumadin.  However, he is on ASA and plavix.   -follow-up with PCP regarding alternatives to coumadin; pt appears to have Part D  #Bradycardia-Dr. Dough, PCP, started metoprolol on 07/09/2013; but due to bradycardia, and on the advice of cardiology, we stopped the metoprolol.  #CAD s/p prior stenting-Pt reports stenting at [facility].  However, have not found records to date.   -attempt to obtain records   #DM2- Pt was on SSI at home; his PCP changed his dose 2 days ago.  According  to records last HA1c was 11.4.   -HA1c pending -CBG   #HTN- Stable.   -continue home meds  -TTE today; pending  #Dyslipidemia-  -continue home meds   Lab Results  Component Value Date   CHOL 135 07/12/2013   HDL 37* 07/12/2013   LDLCALC 75 07/12/2013   TRIG 113 07/12/2013   CHOLHDL 3.6 07/12/2013   #h/o RA- Pt reports a h/o RA for which he takes prednisone 5mg  daily.   -continue home meds  #Electrolyte abnormalities- Replete as needed   Dispo: Disposition is deferred at this time, awaiting improvement of current medical problems.  Anticipated discharge today.    The patient does have a current PCP Dina Rich, MD) and does need an [facility] hospital follow-up appointment after  discharge.   .Services Needed at time of discharge: Y = Yes, Blank = No PT:   OT:   RN:   Equipment:   Other:     LOS: 1 day   Boykin Peek, MD 07/12/2013, 11:34 AM

## 2013-07-20 NOTE — Discharge Summary (Signed)
Name: Oscar Calderon MRN: [id number] DOB: Mar 17, 1935 78 y.o. PCP: Dina Rich, MD  Date of Admission: 07/11/2013  9:16 AM Date of Discharge: 07/20/2013 Attending Physician: Dr. Margarito Liner, MD  Discharge Diagnosis: Principal Problem:   Chest pain Active Problems:   Peripheral vascular disease   Diabetes   Coronary artery disease   Acute angina  Discharge Medications:   Medication List    STOP taking these medications       metoprolol succinate 25 MG 24 hr tablet  Commonly known as:  TOPROL-XL      TAKE these medications       atorvastatin 40 MG tablet  Commonly known as:  LIPITOR  Take 40 mg by mouth daily.     clopidogrel 75 MG tablet  Commonly known as:  PLAVIX  Take 75 mg by mouth daily with breakfast.     furosemide 40 MG tablet  Commonly known as:  LASIX  Take 40 mg by mouth 2 (two) times daily.     hydrocortisone cream 1 %  Apply 1 application topically as needed for itching.     MENTHOL-METHYL SALICYLATE EX  Apply 1 application topically 3 (three) times daily as needed (muscle pain).     predniSONE 5 MG tablet  Commonly known as:  DELTASONE  Take 5 mg by mouth daily with breakfast.     traMADol 50 MG tablet  Commonly known as:  ULTRAM  Take 50 mg by mouth every 6 (six) hours as needed for moderate pain.     traZODone 50 MG tablet  Commonly known as:  DESYREL  Take 50 mg by mouth at bedtime.        Disposition and follow-up:   Oscar Calderon was discharged from [facility] in Stable condition.  At the hospital follow up visit please address:  1.  Continued chest pain; medication compliance (HA1c suggests difficulty controlling blood sugar); anti-coagulation for atrial fibrillation; cardiology follow-up   2.  Labs / imaging needed at time of follow-up: BMP  3.  Pending labs/ test needing follow-up: None  Follow-up Appointments:     Follow-up Information   Follow up with DOUGH,ROBERT, MD On 07/15/2013. (3:45PM)    Specialty:  Family Medicine   Contact information:   [address] [place] [address] [phone]       Follow up with Donato Schultz, MD On 08/02/2013. (8:15AM)    Specialty:  Cardiology   Contact information:   [address] [place] [address] [phone]       Discharge Instructions: Discharge Orders   Future Appointments Provider Department Dept Phone   08/02/2013 8:15 AM Donato Schultz, MD [facility] [address] [phone]   Future Orders Complete By Expires   (HEART FAILURE PATIENTS) Call MD:  Anytime you have any of the following symptoms: 1) 3 pound weight gain in 24 hours or 5 pounds in 1 week 2) shortness of breath, with or without a dry hacking cough 3) swelling in the hands, feet or stomach 4) if you have to sleep on extra pillows at night in order to breathe.  As directed    Diet - low sodium heart healthy  As directed    Increase activity slowly  As directed       Consultations: Treatment Team:  Donato Schultz, MD  Procedures Performed:  Dg Chest Port 1 View  07/11/2013   CLINICAL DATA:  Chest pain  EXAM: PORTABLE CHEST - 1 VIEW  COMPARISON:  None.  FINDINGS: The cardiac shadow is mildly enlarged. Mild vascular congestion is noted. No focal infiltrate is seen. No acute bony abnormality is noted.  IMPRESSION: Mild congestive failure.   Electronically Signed   By: Alcide Clever M.D.   On: 07/11/2013 10:10    2D Echo:  Study Conclusions: - Left ventricle: The cavity size was moderately dilated. Systolic function was severely reduced. The estimated ejection fraction was in the range of 15% to 20%. Diffuse hypokinesis with asynchronous contraction (right bundle branch block). - Mitral valve: Mild regurgitation. - Left atrium: The atrium was mildly dilated. Impressions: - Previous clinic encounter in [place] denotes chronic systolic heart failure.  Cardiac Cath: N/A  Admission HPI: Pt is a 78 yo male from [place], [place] with a PMH  significant for HTN, DM2, dyslipidemia, RA, CAD s/p stents, Atrial Fibrillation. Patient c/o chest pain. Onset was last evening at 1:00am while he was lying in the bed. He took 2 NTG tabs with no relief. He states the pain is sharp and located at his left shoulder and radiates across his chest to his right upper abdomen. He says the CP was continuous and then began to radiate into his left neck. He states it "felt like gas." He denies any N/V, cough, diaphoresis, dizziness, or presyncope. He states he has SOB but this is at his baseline. He is without orthopnea or PND. He also states the CP feels like his normal angina but is slightly worse. He states that NTG usually makes his CP go away. Currently he denies any CP after receiving NTG paste in the ED. He denies any GERD symptoms. He does report having moved a queen mattress and box springs on Friday. He denies current tobacco use, but used to smoke for 35 years ago. He reports using CPAP at night. He denies any other recreational drug use.  Pt has bilateral hearing aids and lives alone and has a neighbor who frequently checks on him. He apparently was out of his medication for a time.  According to ED notes, he received 3 NTG tablets and ASA by EMS and his CP decreased to 3/10. His EKG in route was suspicious for a STEMI which was called by EMS. Cardiology was present upon arrival and activated code STEMI. Pt appears to have a LBBB but we have no previous EKG for comparison. There were no concerning features of the LBBB. He was afebrile and VS were wnl except for mild bradycardia (46-79). He received NTG paste and called IMTS to admit.  Additionally, he has a h/o atrial fibrillation but refuses coumadin.    Hospital Course by problem list: Principal Problem:   Chest pain Active Problems:   Peripheral vascular disease   Diabetes   Coronary artery disease   Acute angina   #Atypical Chest Pain- Resolved.  Pt has risk factors for ACS including age, DM2,  HTN, dyslipidemia, CAD s/p 4 stents. TIMI score: 4, which is a 20% risk at 14 days of: all-cause mortality, new or recurrent MI, or severe recurrent ischemia requiring urgent revascularization. ACS seems unlikely given CP is continuous, description of sharp quality, non-exertional, "feels like gas". However, ACS must be r/o given risk factors. Other possible etiologies include: musculoskeletal (pt reports moving a mattress/box springs 2 days ago), pneumothorax (no CXR findings), aortic dissection (pain not described as tearing/ripping, no widened mediastinum on CXR), PE (no pleuritic CP, no tachycardia, Wells Score: 0), GERD (denies symptoms), anxiety (pt does not appear anxious/no previous h/o), pneumonia (afebrile,  not pleuritic CP, no leukocytosis). Troponin X 3 negative. EKG showed some T wave inversions in V5, V6-no previous EKG for comparison. He had a 2-3 s run of non-sustained V-tach. Cardiology consulted and recommend obtaining records but were not revealing from his PCP.  He claimed to have stents put in at [facility] in [place] but a care everywhere search did not have a pt by that name.   07/12/13 TTE revealed an EF of 15-20% with diffuse hypokinesis.  Cardiology recommended close follow-up.  HA1c 11.2.  TSH wnl.  Lipid panel wnl (HDL slightly low at 37).  #Chronic Atrial Fibrillation-Pt has atrial fibrillation on EKG with slow ventricular response. Pt has refused to take coumadin now and in the past. CHADS2 Score: 3. High risk of thromboembolic event. 5.9% risk of event per year if no coumadin. The adjusted stroke rate was the expected stroke rate per 100 person-years derived from the multivariable model assuming that aspirin was not taken. INR is sub-therapeutic, however, pt is not on coumadin. However, he is on ASA and plavix.  Suggest follow-up with PCP regarding alternatives to coumadin; pt appears to have Part D insurance.  Pt should follow-up with Dr. Anne Fu (cardiology) since unable to locate  prior cardiologist.   #Bradycardia-Resolved.  PCP started metoprolol on 07/09/2013; but due to bradycardia, and on the advice of cardiology, we stopped the metoprolol.   #CAD s/p prior stenting-Pt reports stenting at [facility]. However, have not found records to date--no patient with that name in system.  See TTE results above.  He will follow-up with Dr. Anne Fu.   #Uncontrolled DM2- Pt was on SSI at home; his PCP changed his dose 2 days ago. According to records last HA1c was 11.4.  HA1c during admission was 11.2.  CBGs were checked during admission.     #HTN- Stable--continued home meds.     #Dyslipidemia- Stable--continued home meds.  Lab Results   Component  Value  Date    CHOL  135  07/12/2013    HDL  37*  07/12/2013    LDLCALC  75  07/12/2013    TRIG  113  07/12/2013    CHOLHDL  3.6  07/12/2013    #h/o RA?- Pt reports a h/o RA for which he takes prednisone 5mg  daily. We continued during admission.    #Electrolyte abnormalities- Repleted as needed.  Discharge Vitals:   BP 125/62  Pulse 67  Temp(Src) 97.4 F (36.3 C) (Oral)  Resp 18  Ht 6\' 1"  (1.854 m)  Wt 89.1 kg (196 lb 6.9 oz)  BMI 25.92 kg/m2  SpO2 98%  Discharge Labs:  No results found for this or any previous visit (from the past 24 hour(s)).  Signed: Boykin Peek, MD 07/20/2013, 7:58 PM   Time Spent on Discharge: 45 minutes Services Ordered on Discharge: None Equipment Ordered on Discharge: None

## 2013-07-24 ENCOUNTER — Encounter: Payer: Self-pay | Admitting: Cardiology

## 2013-08-02 ENCOUNTER — Encounter: Payer: Medicare Other | Admitting: Cardiology

## 2013-08-26 ENCOUNTER — Ambulatory Visit (INDEPENDENT_AMBULATORY_CARE_PROVIDER_SITE_OTHER): Payer: Medicare Other | Admitting: Cardiology

## 2013-08-26 ENCOUNTER — Encounter: Payer: Medicare Other | Admitting: Pharmacist

## 2013-08-26 ENCOUNTER — Encounter: Payer: Self-pay | Admitting: Cardiology

## 2013-08-26 ENCOUNTER — Telehealth: Payer: Self-pay | Admitting: Pharmacist

## 2013-08-26 VITALS — BP 135/72 | HR 58 | Ht 74.0 in | Wt 207.4 lb

## 2013-08-26 DIAGNOSIS — I5022 Chronic systolic (congestive) heart failure: Secondary | ICD-10-CM

## 2013-08-26 DIAGNOSIS — R001 Bradycardia, unspecified: Secondary | ICD-10-CM

## 2013-08-26 DIAGNOSIS — I4891 Unspecified atrial fibrillation: Secondary | ICD-10-CM

## 2013-08-26 DIAGNOSIS — I255 Ischemic cardiomyopathy: Secondary | ICD-10-CM

## 2013-08-26 DIAGNOSIS — I2589 Other forms of chronic ischemic heart disease: Secondary | ICD-10-CM

## 2013-08-26 DIAGNOSIS — I498 Other specified cardiac arrhythmias: Secondary | ICD-10-CM

## 2013-08-26 MED ORDER — LISINOPRIL 10 MG PO TABS: 10.0000 mg | ORAL_TABLET | Freq: Every day | ORAL | Status: AC

## 2013-08-26 MED ORDER — CLOPIDOGREL BISULFATE 75 MG PO TABS: 75.0000 mg | ORAL_TABLET | Freq: Every day | ORAL | Status: AC

## 2013-08-26 NOTE — Telephone Encounter (Signed)
Discussed Xarelto vs Plavix today with patient and his daughter.  Patient has a high CHADS-VASC score and refuses warfarin.  He was on plavix until 6 weeks ago when he stopped as Dr. Carney Corners felt it may be worsening his diabetes according to daughter.  No change in blood sugar since stopping plavix.  He was in the donut hole last year and ultimately went without meds for almost 4 months.  They want to make sure this doesn't happen again.  They would like to go back on plavix daily for now, and they will look into cost of Xarelto and if they can change their Medicare D plan to where they won't hit the donut hole.  They will start plavix today and call back with answer regarding changing to Xarelto.  To Dr. Anne Fu as Lorain Childes.

## 2013-08-26 NOTE — Progress Notes (Signed)
[address] [place], [place]  [address] Phone: [phone] Fax:  [phone]  Date:  08/26/2013   ID:  Oscar Calderon, DOB [birth year], MRN [id number]  PCP:  Dina Rich, MD   History of Present Illness: Oscar Calderon is a 79 y.o. male with diabetes, hypertension, hyperlipidemia and coronary artery disease status post stent in 2013 at [facility] with 3 prior stents to unknown artery who was discharged from hospital on 07/12/13 with atypical chest pain, right shoulder, likely musculoskeletal with troponins that were normal. He had no signs of ischemia on EKG. He had recent stress test at outside hospital.  Atrial fibrillation was also noted and we discussed anticoagulation. He said he would never take Coumadin. He understands his increased stroke risk. Dementia may be an issue as well. He is followed by Dr. Dina Rich b was in [facility]. Chronic systolic heart failure as well with ejection fraction of 15%. Beta blocker because of bradycardia. No evidence of congestion.  He is seeing a previous cardiologist but he is here today to see me.  Wt Readings from Last 3 Encounters:  08/26/13 207 lb 6.4 oz (94.076 kg)  07/12/13 196 lb 6.9 oz (89.1 kg)     Past Medical History  Diagnosis Date  . Hypertension   . Diabetes   . Hypercholesterolemia   . Dementia   . CAD (coronary artery disease)     Stent in 2013 at [facility], with 3 previous stents to unknown arteries at unknown date.    Past Surgical History  Procedure Laterality Date  . Cardiac surgery    . Cataract extraction      Current Outpatient Prescriptions  Medication Sig Dispense Refill  . atorvastatin (LIPITOR) 40 MG tablet Take 40 mg by mouth daily.      . furosemide (LASIX) 40 MG tablet Take 40 mg by mouth 2 (two) times daily.      . hydrocortisone cream 1 % Apply 1 application topically as needed for itching.      Marland Kitchen MENTHOL-METHYL SALICYLATE EX Apply 1 application topically 3  (three) times daily as needed (muscle pain).      . predniSONE (DELTASONE) 5 MG tablet Take 5 mg by mouth daily with breakfast.      . traMADol (ULTRAM) 50 MG tablet Take 50 mg by mouth every 6 (six) hours as needed for moderate pain.      . traZODone (DESYREL) 50 MG tablet Take 50 mg by mouth at bedtime.       No current facility-administered medications for this visit.    Allergies:    Allergies  Allergen Reactions  . Codeine   . Other     Sodium Penothol    Social History:  The patient  reports that he has quit smoking. He does not have any smokeless tobacco history on file. He reports that he does not drink alcohol or use illicit drugs.   No family history on file.  ROS:  Please see the history of present illness.   No syncope, no bleeding, no orthopnea, no pnd   All other systems reviewed and negative.   PHYSICAL EXAM: VS:  BP 135/72  Pulse 58  Ht 6\' 2"  (1.88 m)  Wt 207 lb 6.4 oz (94.076 kg)  BMI 26.62 kg/m2 Well nourished, well developed, in no acute distress HEENT: normal, [place]/AT, EOMI Neck: no JVD, normal carotid upstroke, no bruit Cardiac:  normal S1, S2; RRR; no murmur Lungs:  clear to auscultation bilaterally, no wheezing, rhonchi or rales Abd: soft, nontender, no hepatomegaly, no bruits Ext: no edema, 2+ distal pulses Skin: warm and dry GU: deferred Neuro: no focal abnormalities noted, AAO x 3  EKG:  12th 14/14-atrial fibrillation with slow ventricular response, heart rate 42   Echocardiogram: 07/12/13: - Left ventricle: The cavity size was moderately dilated. Systolic function was severely reduced. The estimated ejection fraction was in the range of 15% to 20%. Diffuse hypokinesis with asynchronous contraction (right bundle branch block). - Mitral valve: Mild regurgitation. - Left atrium: The atrium was mildly dilated. Impressions:  - Previous clinic encounter in [place] denotes chronic systolic heart failure.  Labs: 07/12/13-potassium 3.4, creatinine  1.0, glucose 275, LDL 75, hemoglobin 13.5, BNP 3306  ASSESSMENT AND PLAN:  1. Chronic systolic heart failure/cardiomyopathy-ischemic cardiomyopathy with previously placed stents as described above. Ejection fraction severely reduced. Difficult social situation. We will try to optimize his heart failure regimen. Unfortunately, cannot use beta blocker because his bradycardia previously with atrial fibrillation. 2. Coronary artery disease-previous  placed stents at [facility]. 3. Atrial fibrillation-CHADS-VASc (6) understand risk of stroke without anticoagulation. Refused Coumadin. I will have him speak with pharmacy about the possibility of Xarelto , cost may be an issue. If he refuses, I will restart Plavix. He understands that this is not adequate enough to cover against stroke for atrial fibrillation. His daughter was present. 4. We'll have close followup with APP in one to 2 weeks with BMET.  Signed, Donato SchultzMark Daffney Greenly, MD [facility]  08/26/2013 9:09 AM

## 2013-08-26 NOTE — Patient Instructions (Signed)
Your physician has recommended you make the following change in your medication:   1. Start Lisinopril 10 mg once daily  Your physician recommends that you return for lab work in: 1 week , BMET  * Referred to Pharmacy for Xarelto consult.  Your physician recommends that you schedule a follow-up appointment in: 1 week with PA or NP

## 2013-09-03 ENCOUNTER — Ambulatory Visit (INDEPENDENT_AMBULATORY_CARE_PROVIDER_SITE_OTHER): Payer: Medicare Other | Admitting: Physician Assistant

## 2013-09-03 ENCOUNTER — Other Ambulatory Visit: Payer: Medicare Other

## 2013-09-03 ENCOUNTER — Encounter: Payer: Self-pay | Admitting: Physician Assistant

## 2013-09-03 ENCOUNTER — Encounter (INDEPENDENT_AMBULATORY_CARE_PROVIDER_SITE_OTHER): Payer: Self-pay

## 2013-09-03 VITALS — BP 144/86 | HR 75 | Ht 74.0 in | Wt 210.0 lb

## 2013-09-03 DIAGNOSIS — I10 Essential (primary) hypertension: Secondary | ICD-10-CM

## 2013-09-03 DIAGNOSIS — I5022 Chronic systolic (congestive) heart failure: Secondary | ICD-10-CM

## 2013-09-03 DIAGNOSIS — I2510 Atherosclerotic heart disease of native coronary artery without angina pectoris: Secondary | ICD-10-CM

## 2013-09-03 DIAGNOSIS — I429 Cardiomyopathy, unspecified: Secondary | ICD-10-CM

## 2013-09-03 DIAGNOSIS — E785 Hyperlipidemia, unspecified: Secondary | ICD-10-CM

## 2013-09-03 DIAGNOSIS — I4891 Unspecified atrial fibrillation: Secondary | ICD-10-CM

## 2013-09-03 DIAGNOSIS — I428 Other cardiomyopathies: Secondary | ICD-10-CM

## 2013-09-03 MED ORDER — METOPROLOL SUCCINATE ER 25 MG PO TB24: 25.0000 mg | ORAL_TABLET | Freq: Every day | ORAL | Status: AC

## 2013-09-03 MED ORDER — ASPIRIN EC 81 MG PO TBEC: 81.0000 mg | DELAYED_RELEASE_TABLET | Freq: Every day | ORAL | Status: AC

## 2013-09-03 MED ORDER — FAMOTIDINE 20 MG PO TABS: 20.0000 mg | ORAL_TABLET | Freq: Two times a day (BID) | ORAL | Status: AC

## 2013-09-03 NOTE — Progress Notes (Signed)
[address] [place], [place]  [address] Phone: [phone] Fax:  [phone]  Date:  09/03/2013   ID:  Oscar Calderon, DOB 1934/08/14, MRN [id number]  PCP:  Dina Rich, MD  Cardiologist:  Dr. Donato Schultz     History of Present Illness: Oscar Calderon is a 79 y.o. male with a hx of CAD, s/p prior PCI with Xience DES to LAD at [facility] in 2011 and most recent PCI with stenting in 2013 ([facility] - records unavailable), cardiomyopathy with EF 15%, systolic CHF, AFib, PAD (s/p prior iliac stents), HTN, HL, DM. Patient has refused coumadin and risks of stroke have been discussed with him in the past.  He is not on beta blockers due to bradycardia.   He was seen by Dr. Donato Schultz in the hospital in 06/2013 for chest pain that was atypical. CEs were negative.  He was previously established with a cardiologist elsewhere.  The chart indicates he had a stress test in 06/2013 at an outside hospital (no record in [facility]).  Patient denies having a stress test.  Last seen here by Dr. Donato Schultz on 08/26/13.  He was placed on ACEI and set up for f/u today.  He did meet with Alfonse Ras, PharmD to discuss starting Xarelto.  He is not sure this is affordable and restarted Plavix.    Patient is a difficult historian. He does note discomfort in his chest when he lays down in bed at night. He notices it after certain meals. He denies any symptoms that remind him of his previous angina. He has taken TUMS as well as nitroglycerin with relief. He denies exertional chest symptoms. He is fairly sedentary due to chronic back problems. He has chronic dyspnea. He probably describes NYHA class IIb-III symptoms. He sleeps on 3 pillows chronically. He denies PND or pedal edema. He denies increased abdominal girth.  Recent Labs: 07/11/2013: Hemoglobin 13.5; Pro B Natriuretic peptide (BNP) 3306.0*  07/12/2013: ALT 12; Creatinine 1.00; HDL Cholesterol 37*; LDL (calc) 75; Potassium 3.4*; TSH 1.918   Wt  Readings from Last 3 Encounters:  09/03/13 210 lb (95.255 kg)  08/26/13 207 lb 6.4 oz (94.076 kg)  07/12/13 196 lb 6.9 oz (89.1 kg)     Past Medical History  Diagnosis Date  . Hypertension   . Diabetes   . Hypercholesterolemia   . Dementia   . CAD (coronary artery disease)     Stent in 2013 at [facility], with 3 previous stents to unknown arteries at unknown date.  . Cardiomyopathy     a.  Echo (07/12/13):  EF 15-20%, diff HK, mild MR, mild LAE.  Marland Kitchen Chronic systolic CHF (congestive heart failure)     Current Outpatient Prescriptions  Medication Sig Dispense Refill  . atorvastatin (LIPITOR) 40 MG tablet Take 40 mg by mouth daily.      . clopidogrel (PLAVIX) 75 MG tablet Take 1 tablet (75 mg total) by mouth daily.  30 tablet  5  . furosemide (LASIX) 40 MG tablet Take 40 mg by mouth 2 (two) times daily.      . hydrocortisone cream 1 % Apply 1 application topically as needed for itching.      Marland Kitchen lisinopril (PRINIVIL,ZESTRIL) 10 MG tablet Take 1 tablet (10 mg total) by mouth daily.  30 tablet  3  . MENTHOL-METHYL SALICYLATE EX Apply 1 application topically 3 (three) times daily as needed (muscle pain).      . predniSONE (DELTASONE) 5  MG tablet Take 5 mg by mouth daily with breakfast.      . traMADol (ULTRAM) 50 MG tablet Take 50 mg by mouth every 6 (six) hours as needed for moderate pain.      . traZODone (DESYREL) 50 MG tablet Take 50 mg by mouth at bedtime.       No current facility-administered medications for this visit.    Allergies:   Codeine and Other   Social History:  The patient  reports that he has quit smoking. He does not have any smokeless tobacco history on file. He reports that he does not drink alcohol or use illicit drugs.   Family History:  The patient's family history is not on file.   ROS:  Please see the history of present illness.   He has a chronic cough. He has occasional diarrhea.   All other systems reviewed and negative.   PHYSICAL EXAM: VS:  BP 144/86   Pulse 75  Ht 6\' 2"  (1.88 m)  Wt 210 lb (95.255 kg)  BMI 26.95 kg/m2 Well nourished, well developed, in no acute distress HEENT: normal Neck: no JVD Cardiac:  normal S1, S2; irregularly irregular rhythm; no murmur Lungs:  Decreased breath sounds bilaterally, no wheezing, rhonchi or rales Abd: soft, nontender, no hepatomegaly Ext: no edema Skin: warm and dry Neuro:  CNs 2-12 intact, no focal abnormalities noted  EKG:  Atrial fibrillation, HR 75, LBBB     ASSESSMENT AND PLAN:  1. Chronic Systolic CHF:  His volume appears stable.  Check a follow up basic metabolic panel today. Continue current therapy. 2. Cardiomyopathy:  Likely ischemic. Records are scant. He was currently kept off of beta blockers due to bradycardia. His heart rate today can certainly tolerate some beta blocker.  Continued ACEI. Start Toprol-XL 25 mg daily. Adjust further medications as his blood pressure will tolerate. I did discuss the possibility of whether or not the patient would be a candidate for ICD implantation. At this point in time, he does not seem to be interested. He did tell me he would think about it. In the event that he is interested in proceeding with ICD implantation, we would need to pursue aggressive medication titration and follow up echocardiogram. 3. CAD:  He denies symptoms consistent with his previous angina. Continue Plavix. Resume aspirin as he is unwilling to start Coumadin. Start beta blocker therapy as noted above. Continue statin. 4. Atrial Fibrillation:  Rate controlled. He is unwilling to take Coumadin. He cannot afford Xarelto. Continue Plavix and aspirin. 5. Hypertension:  Controlled. 6. Hyperlipidemia:  Continue statin. 7. Chest pain: He is describing symptoms that are more consistent with GERD. Start Pepcid 20 mg twice daily. 8. Disposition:  Follow up with Dr. Anne FuSkains or me in one month.  Signed, Tereso NewcomerScott Eleina Jergens, PA-C  09/03/2013 11:58 AM

## 2013-09-03 NOTE — Patient Instructions (Addendum)
START ASPRIN 81 MG DAILY START PEPCID 20 MG TWICE DAILY; RX SENT IN START METOPROLOL ER 25 MG DAILY; RX SENT IN  LAB WORK TODAY; BMET  PLEASE FOLLOW UP WITH DR. Caro Hight IN 1 MONTH OR SCOTT WEAVER, PAC SAME DAY DR. Anne Fu IS IN THE OFFICE

## 2013-09-06 ENCOUNTER — Telehealth: Payer: Self-pay | Admitting: *Deleted

## 2013-09-06 LAB — BASIC METABOLIC PANEL
BUN: 15 mg/dL (ref 6–23)
CALCIUM: 8.5 mg/dL (ref 8.4–10.5)
CHLORIDE: 104 meq/L (ref 96–112)
CO2: 27 meq/L (ref 19–32)
Creatinine, Ser: 1 mg/dL (ref 0.4–1.5)
GFR: 77.19 mL/min (ref >60.00)
GLUCOSE: 124 mg/dL — AB (ref 70–99)
Potassium: 4 mEq/L (ref 3.5–5.1)
Sodium: 136 mEq/L (ref 135–145)

## 2013-09-06 NOTE — Telephone Encounter (Signed)
pt notified about lab results with verbal understanding, said he may need to change the 3/12 appt w/Scott W. PA due to his Zenaida Niece is needing repairs, advised pt tcb and just let us know. He is ok and thank you.

## 2013-10-07 ENCOUNTER — Ambulatory Visit: Payer: Medicare Other | Admitting: Physician Assistant

## 2013-10-25 ENCOUNTER — Ambulatory Visit: Payer: Medicare Other | Admitting: Physician Assistant

## 2013-11-19 ENCOUNTER — Ambulatory Visit: Payer: Medicare Other | Admitting: Physician Assistant

## 2015-09-06 IMAGING — DX DG CHEST 1V PORT
1 series · 1 of 1 positions shown · non-contrast
Comparison: None.

CLINICAL DATA: Chest pain

EXAM:
PORTABLE CHEST - 1 VIEW

[portable]
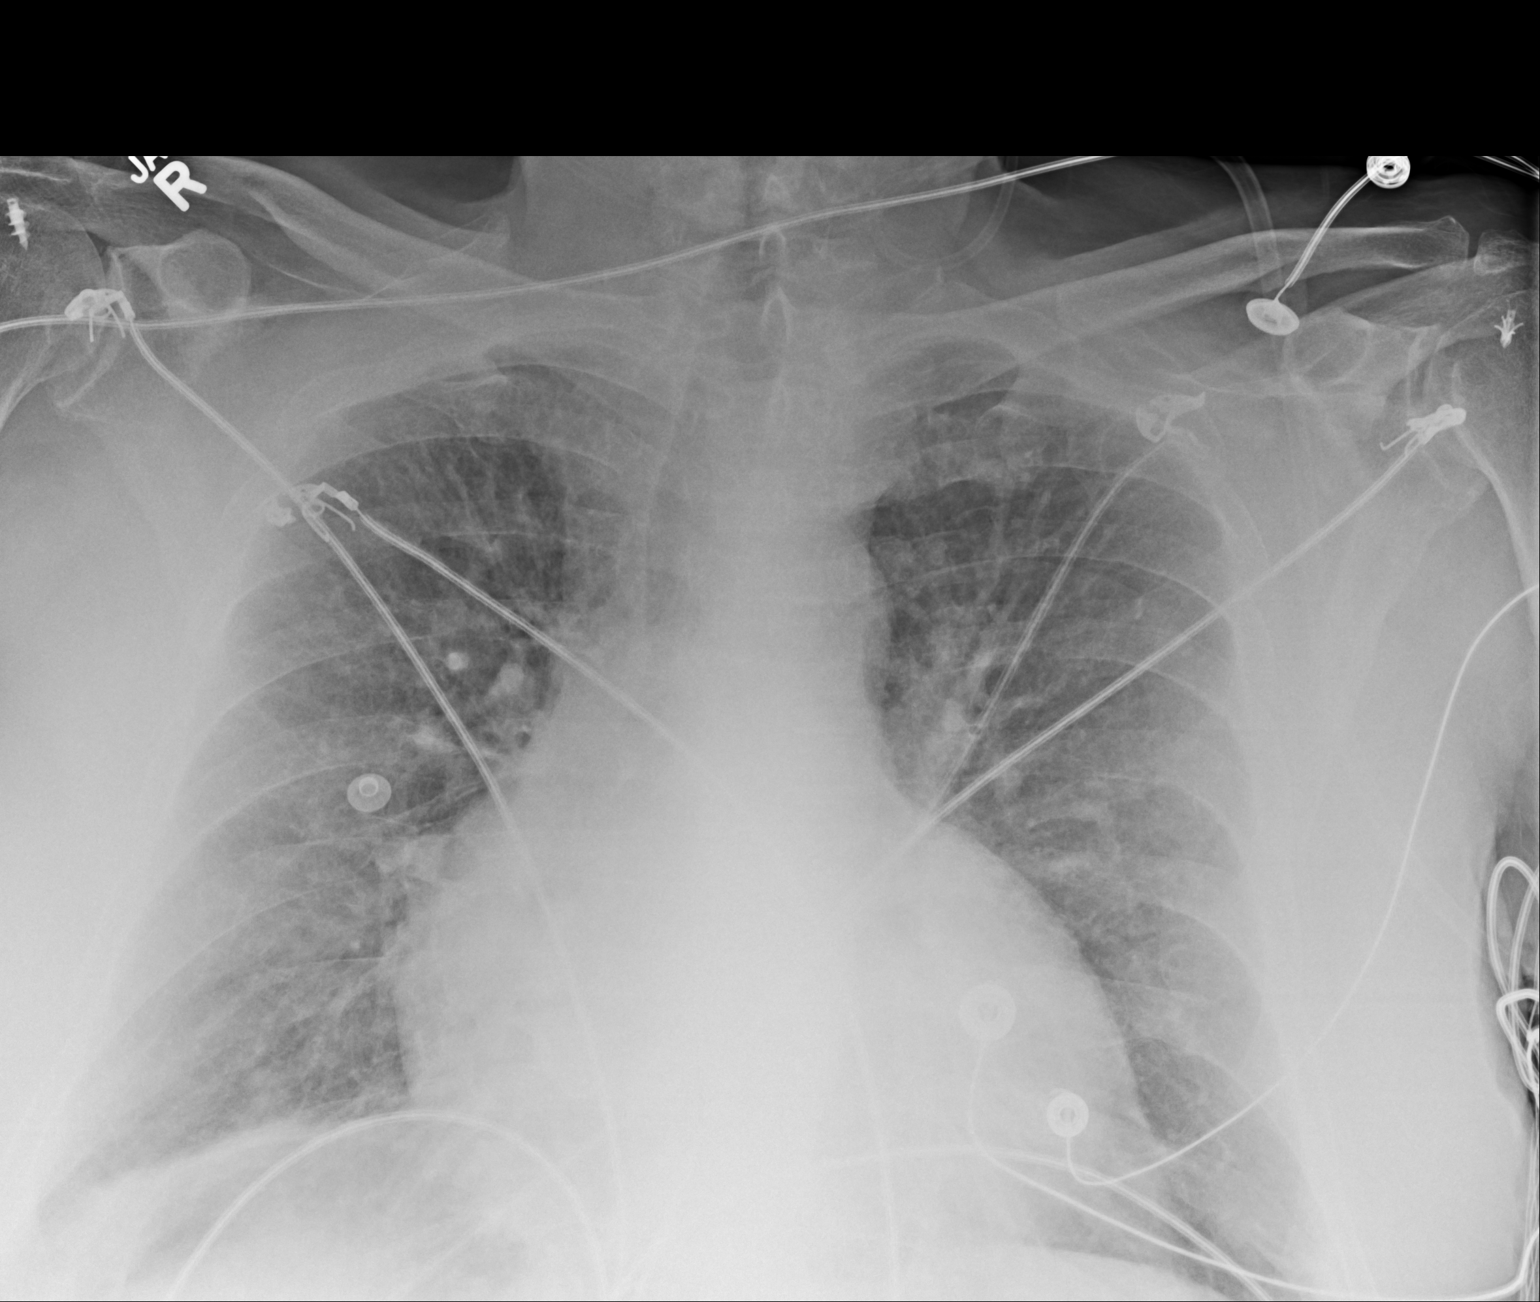

[1 of 1 positions shown; findings below may reference images not displayed]

FINDINGS: The cardiac shadow is mildly enlarged. Mild vascular congestion is
noted. No focal infiltrate is seen. No acute bony abnormality is
noted.
IMPRESSION: Mild congestive failure.

## 2016-01-27 DEATH — deceased
